# Patient Record
Sex: Female | Born: 1990
Health system: Southern US, Community
[De-identification: ages and names within clinical notes are randomized; demographics above are authoritative.]

## PROBLEM LIST (undated history)

## (undated) DIAGNOSIS — R000 Tachycardia, unspecified: Secondary | ICD-10-CM

## (undated) HISTORY — PX: TONSILLECTOMY: SUR1361

## (undated) HISTORY — DX: Tachycardia, unspecified: R00.0

---

## 2017-01-29 DIAGNOSIS — Z6841 Body Mass Index (BMI) 40.0 and over, adult: Secondary | ICD-10-CM | POA: Diagnosis not present

## 2017-01-29 DIAGNOSIS — Z23 Encounter for immunization: Secondary | ICD-10-CM | POA: Diagnosis not present

## 2017-01-29 DIAGNOSIS — Z0000 Encounter for general adult medical examination without abnormal findings: Secondary | ICD-10-CM | POA: Diagnosis not present

## 2017-01-29 DIAGNOSIS — Z1389 Encounter for screening for other disorder: Secondary | ICD-10-CM | POA: Diagnosis not present

## 2017-06-12 DIAGNOSIS — B351 Tinea unguium: Secondary | ICD-10-CM | POA: Diagnosis not present

## 2017-06-19 ENCOUNTER — Encounter: Payer: Self-pay | Admitting: Podiatry

## 2017-07-13 ENCOUNTER — Encounter: Payer: Self-pay | Admitting: Sports Medicine

## 2017-07-13 ENCOUNTER — Ambulatory Visit (INDEPENDENT_AMBULATORY_CARE_PROVIDER_SITE_OTHER): Payer: BLUE CROSS/BLUE SHIELD | Admitting: Sports Medicine

## 2017-07-13 VITALS — BP 123/74 | HR 93 | Ht 67.0 in | Wt 297.0 lb

## 2017-07-13 DIAGNOSIS — B351 Tinea unguium: Secondary | ICD-10-CM | POA: Diagnosis not present

## 2017-07-13 DIAGNOSIS — M79674 Pain in right toe(s): Secondary | ICD-10-CM | POA: Diagnosis not present

## 2017-07-13 DIAGNOSIS — L602 Onychogryphosis: Secondary | ICD-10-CM

## 2017-07-13 NOTE — Patient Instructions (Signed)

## 2017-07-13 NOTE — Progress Notes (Signed)
Subjective: Heidi Perkins is a 27 y.o. female patient seen today in office with complaint of severely painful thickened and discolored nail right great toenail.  Patient is desiring treatment for nail changes; has tried OTC topicals/Medication in the past and toenail procedures with no improvement. Reports that right great toenail is significantly painful and is becoming difficult to manage: Had the nail removed before but keeps coming back deformed. Patient has no other pedal complaints at this time.   Review of Systems  Skin:       Nail changes     There are no active problems to display for this patient.   No current outpatient medications on file prior to visit.   No current facility-administered medications on file prior to visit.     Not on File  Objective: Physical Exam  General: Well developed, nourished, no acute distress, awake, alert and oriented x 3  Vascular: Dorsalis pedis artery 2/4 bilateral, Posterior tibial artery 2/4 bilateral, skin temperature warm to warm proximal to distal bilateral lower extremities, no varicosities, pedal hair present bilateral.  Neurological: Gross sensation present via light touch bilateral.   Dermatological: Skin is warm, dry, and supple bilateral, right hallux toenail is severely thickened and a ram's horn appearance with significant amount of dried blood and debris underneath the right great toenail with mild swelling around nail fold no erythema no active drainage mild tenderness to palpation.  All other nails are within normal limits without any significant deformity or significant fungal changes, no webspace macerations present bilateral, no open lesions present bilateral, no callus/corns/hyperkeratotic tissue present bilateral. No signs of infection bilateral.  Musculoskeletal: Tenderness palpation to right great toenail.  No symptomatic boney deformities noted bilateral. Muscular strength within normal limits without painon range of  motion. No pain with calf compression bilateral.  Assessment and Plan:  Problem List Items Addressed This Visit    None    Visit Diagnoses    Dermatophytosis of nail    -  Primary   Onychogryphosis       Toe pain, right          -Examined patient -Discussed treatment options for painful dystrophic right great toenail that is severely deformed consistent with onychogryphosis and dermatophytosis Discussed treatment alternatives and plan of care; Explained permanent/temporary nail avulsion and post procedure course to patient.  Patient opts for complete removal of right first toenail and a permanent approach using phenol. - After a verbal consent, injected 3 ml of a 50:50 mixture of 2% plain  lidocaine and 0.5% plain marcaine in a normal hallux block fashion. Next, a  betadine prep was performed. Anesthesia was tested and found to be appropriate.  The offending right great toenail was then incised from the hyponychium to the epinychium. The offending right great toenail was removed in total and cleared from the field. The area was curretted for any remaining nail or spicules. Phenol application performed and the area was then flushed with alcohol and dressed with antibiotic cream and a dry sterile dressing. -Patient was instructed to leave the dressing intact for today and begin soaking  in a weak solution of betadine and water tomorrow. Patient was instructed to  soak for 15 minutes each day and apply neosporin and a gauze or bandaid dressing each day. -Patient was instructed to monitor the toe for signs of infection and return to office if toe becomes red, hot or swollen. -Patient to return to office in 2 weeks for follow-up nail bed check or sooner  if problems or issues arise.  Asencion Islamitorya Debrina Kizer, DPM

## 2017-07-26 ENCOUNTER — Ambulatory Visit (INDEPENDENT_AMBULATORY_CARE_PROVIDER_SITE_OTHER): Payer: BLUE CROSS/BLUE SHIELD | Admitting: Sports Medicine

## 2017-07-26 ENCOUNTER — Encounter: Payer: Self-pay | Admitting: Sports Medicine

## 2017-07-26 DIAGNOSIS — B351 Tinea unguium: Secondary | ICD-10-CM

## 2017-07-26 DIAGNOSIS — M79674 Pain in right toe(s): Secondary | ICD-10-CM

## 2017-07-26 DIAGNOSIS — L602 Onychogryphosis: Secondary | ICD-10-CM

## 2017-07-26 NOTE — Patient Instructions (Signed)

## 2017-07-26 NOTE — Progress Notes (Signed)
Subjective: Heidi Perkins is a 27 y.o. female patient returns to office today for follow up evaluation after having Right hallux total permanent nail avulsion performed on 07-13-17. Patient has been soaking using epsom salt and applying topical antibiotic covered with bandaid daily.Admits soreness. Patient deniesfever/chills/nausea/vomitting/any other related constitutional symptoms at this time.  There are no active problems to display for this patient.   Current Outpatient Medications on File Prior to Visit  Medication Sig Dispense Refill  . NORTREL 1/35, 28, tablet TK 1 T PO D UTD  3   No current facility-administered medications on file prior to visit.     No Known Allergies  Objective:  General: Well developed, nourished, in no acute distress, alert and oriented x3   Dermatology: Skin is warm, dry and supple bilateral. Right hallux nail bed appears to be clean, with mild fibrogranular tissue and surrounding maceration. (-) Erythema. (-) Edema. (+) minimal clear drainage present. The remaining nails appear unremarkable at this time. There are no other lesions or other signs of infection  present.  Neurovascular status: Intact. No lower extremity swelling; No pain with calf compression bilateral.  Musculoskeletal: Mild tenderness to palpation of the Right hallux. Muscular strength within normal limits bilateral.   Assesement and Plan: Problem List Items Addressed This Visit    None    Visit Diagnoses    Dermatophytosis of nail    -  Primary   Onychogryphosis       Toe pain, right         -Examined patient  -Cleansed right and gently scrubbed with moist guaze at site and applied antibiotic cream covered with bandaid.  -Discussed plan of care with patient. -Patient to continue soaking in a weak solution of Epsom salt and warm water. Patient was instructed to soak for 15-20 minutes each day until the toe appears normal and there is no drainage, redness, tenderness, or swelling  at the procedure site, and apply neosporin and a gauze or bandaid dressing each day as needed. May leave open to air at night to help area heal and to resolve the maceration. -Educated patient on long term care after nail surgery. -Patient was instructed to monitor the toe for reoccurrence and signs of infection; Patient advised to return to office or go to ER if toe becomes red, hot or swollen. -Patient is to return as needed or sooner if problems arise.  Asencion Islamitorya Birdella Sippel, DPM

## 2017-08-10 ENCOUNTER — Encounter: Payer: Self-pay | Admitting: Sports Medicine

## 2017-08-10 ENCOUNTER — Ambulatory Visit: Payer: BLUE CROSS/BLUE SHIELD | Admitting: Sports Medicine

## 2017-08-10 DIAGNOSIS — M79674 Pain in right toe(s): Secondary | ICD-10-CM

## 2017-08-10 DIAGNOSIS — Z9889 Other specified postprocedural states: Secondary | ICD-10-CM

## 2017-08-10 MED ORDER — NEOMYCIN-POLYMYXIN-HC 3.5-10000-1 OT SOLN: OTIC | 0 refills | Status: DC

## 2017-08-10 NOTE — Progress Notes (Signed)
Subjective: Heidi Perkins is a 27 y.o. female patient returns to office today for follow up evaluation after having Right hallux total permanent nail avulsion performed on 07-13-17. Patient states that she wanted to have her toe checked because she has noticed some yellow drainage and some bleeding in the base of the nail bed is still sore.  Patient reports that pain is 5 out of 10 sometimes throbbing in nature.  Patient has been soaking using epsom salt and applying topical antibiotic covered with bandaid daily. Patient deniesfever/chills/nausea/vomitting/any other related constitutional symptoms at this time.  There are no active problems to display for this patient.   Current Outpatient Medications on File Prior to Visit  Medication Sig Dispense Refill  . NORTREL 1/35, 28, tablet TK 1 T PO D UTD  3   No current facility-administered medications on file prior to visit.     No Known Allergies  Objective:  General: Well developed, nourished, in no acute distress, alert and oriented x3   Dermatology: Skin is warm, dry and supple bilateral. Right hallux nail bed appears to be clean, with mild fibrogranular tissue and surrounding maceration. (+) Focal faint erythema at proximal nail fold. (-) Edema. (-) Active drainage present. The remaining nails appear unremarkable at this time. There are no other lesions or other signs of infection  present.  Neurovascular status: Intact. No lower extremity swelling; No pain with calf compression bilateral.  Musculoskeletal: Mild tenderness to palpation of the Right hallux. Muscular strength within normal limits bilateral.   Assesement and Plan: Problem List Items Addressed This Visit    None    Visit Diagnoses    S/P nail surgery    -  Primary   Toe pain, right         -Examined patient  -Cleansed right and gently scrubbed with moist guaze at site and applied antibiotic cream covered with bandaid.  -Discussed plan of care with patient. -Patient  to continue soaking in a weak solution of Epsom salt and warm water. Patient was instructed to soak for 15-20 minutes each day until the toe appears normal and there is no drainage, redness, tenderness, or swelling at the procedure site, and apply Corticosporin solution as prescribed and a gauze or bandaid dressing each day as needed. May leave open to air at night to help area heal and to resolve the maceration. -Educated patient on long term care after nail surgery. -Patient was instructed to monitor the toe for reoccurrence and signs of infection; Patient advised to return to office or go to ER if toe becomes red, hot or swollen. -Patient is to return as needed or sooner if problems arise.  Asencion Islamitorya Amorina Doerr, DPM

## 2017-08-10 NOTE — Patient Instructions (Signed)

## 2017-11-28 DIAGNOSIS — N925 Other specified irregular menstruation: Secondary | ICD-10-CM | POA: Diagnosis not present

## 2017-11-28 DIAGNOSIS — N938 Other specified abnormal uterine and vaginal bleeding: Secondary | ICD-10-CM | POA: Diagnosis not present

## 2017-12-31 DIAGNOSIS — N925 Other specified irregular menstruation: Secondary | ICD-10-CM | POA: Diagnosis not present

## 2018-04-17 DIAGNOSIS — F17210 Nicotine dependence, cigarettes, uncomplicated: Secondary | ICD-10-CM | POA: Diagnosis not present

## 2018-04-17 DIAGNOSIS — R42 Dizziness and giddiness: Secondary | ICD-10-CM | POA: Diagnosis not present

## 2018-04-17 DIAGNOSIS — N390 Urinary tract infection, site not specified: Secondary | ICD-10-CM | POA: Diagnosis not present

## 2018-04-17 DIAGNOSIS — R0602 Shortness of breath: Secondary | ICD-10-CM | POA: Diagnosis not present

## 2018-04-17 DIAGNOSIS — B9689 Other specified bacterial agents as the cause of diseases classified elsewhere: Secondary | ICD-10-CM | POA: Diagnosis not present

## 2018-04-17 DIAGNOSIS — R51 Headache: Secondary | ICD-10-CM | POA: Diagnosis not present

## 2018-04-23 DIAGNOSIS — R1031 Right lower quadrant pain: Secondary | ICD-10-CM | POA: Diagnosis not present

## 2018-04-23 DIAGNOSIS — R109 Unspecified abdominal pain: Secondary | ICD-10-CM | POA: Diagnosis not present

## 2018-04-23 DIAGNOSIS — R1032 Left lower quadrant pain: Secondary | ICD-10-CM | POA: Diagnosis not present

## 2018-04-24 DIAGNOSIS — R109 Unspecified abdominal pain: Secondary | ICD-10-CM | POA: Diagnosis not present

## 2018-04-29 DIAGNOSIS — Z6841 Body Mass Index (BMI) 40.0 and over, adult: Secondary | ICD-10-CM | POA: Diagnosis not present

## 2018-04-29 DIAGNOSIS — R1031 Right lower quadrant pain: Secondary | ICD-10-CM | POA: Diagnosis not present

## 2018-04-29 DIAGNOSIS — N925 Other specified irregular menstruation: Secondary | ICD-10-CM | POA: Diagnosis not present

## 2018-04-30 DIAGNOSIS — R1031 Right lower quadrant pain: Secondary | ICD-10-CM | POA: Diagnosis not present

## 2018-04-30 DIAGNOSIS — R1032 Left lower quadrant pain: Secondary | ICD-10-CM | POA: Diagnosis not present

## 2018-04-30 DIAGNOSIS — K5730 Diverticulosis of large intestine without perforation or abscess without bleeding: Secondary | ICD-10-CM | POA: Diagnosis not present

## 2018-05-14 DIAGNOSIS — R1031 Right lower quadrant pain: Secondary | ICD-10-CM | POA: Diagnosis not present

## 2018-05-14 DIAGNOSIS — R1032 Left lower quadrant pain: Secondary | ICD-10-CM | POA: Diagnosis not present

## 2018-05-14 DIAGNOSIS — R1084 Generalized abdominal pain: Secondary | ICD-10-CM | POA: Diagnosis not present

## 2018-05-15 ENCOUNTER — Other Ambulatory Visit: Payer: Self-pay

## 2018-05-15 ENCOUNTER — Encounter (HOSPITAL_COMMUNITY): Payer: Self-pay | Admitting: Emergency Medicine

## 2018-05-15 ENCOUNTER — Emergency Department (HOSPITAL_COMMUNITY)
Admission: EM | Admit: 2018-05-15 | Discharge: 2018-05-15 | Disposition: A | Discharge status: Home / Self Care | Payer: BLUE CROSS/BLUE SHIELD | Attending: Emergency Medicine | Admitting: Emergency Medicine

## 2018-05-15 ENCOUNTER — Emergency Department (HOSPITAL_COMMUNITY): Payer: BLUE CROSS/BLUE SHIELD

## 2018-05-15 DIAGNOSIS — R102 Pelvic and perineal pain: Secondary | ICD-10-CM | POA: Diagnosis not present

## 2018-05-15 DIAGNOSIS — R112 Nausea with vomiting, unspecified: Secondary | ICD-10-CM

## 2018-05-15 DIAGNOSIS — N926 Irregular menstruation, unspecified: Secondary | ICD-10-CM | POA: Diagnosis not present

## 2018-05-15 LAB — LIPASE, BLOOD: Lipase: 26 U/L (ref 11–51)

## 2018-05-15 LAB — COMPREHENSIVE METABOLIC PANEL
ALBUMIN: 3.7 g/dL (ref 3.5–5.0)
ALT: 19 U/L (ref 0–44)
AST: 18 U/L (ref 15–41)
Alkaline Phosphatase: 79 U/L (ref 38–126)
Anion gap: 9 (ref 5–15)
BUN: 19 mg/dL (ref 6–20)
CHLORIDE: 107 mmol/L (ref 98–111)
CO2: 22 mmol/L (ref 22–32)
Calcium: 9 mg/dL (ref 8.9–10.3)
Creatinine, Ser: 0.61 mg/dL (ref 0.44–1.00)
GFR calc Af Amer: >60 mL/min (ref >60)
GFR calc non Af Amer: >60 mL/min (ref >60)
GLUCOSE: 92 mg/dL (ref 70–99)
Potassium: 3.9 mmol/L (ref 3.5–5.1)
SODIUM: 138 mmol/L (ref 135–145)
Total Bilirubin: 0.4 mg/dL (ref 0.3–1.2)
Total Protein: 8.1 g/dL (ref 6.5–8.1)

## 2018-05-15 LAB — CBC WITH DIFFERENTIAL/PLATELET
Abs Immature Granulocytes: 0.08 10*3/uL — ABNORMAL HIGH (ref 0.00–0.07)
Basophils Absolute: 0.1 10*3/uL (ref 0.0–0.1)
Basophils Relative: 0 %
Eosinophils Absolute: 0.1 10*3/uL (ref 0.0–0.5)
Eosinophils Relative: 1 %
HCT: 45.4 % (ref 36.0–46.0)
Hemoglobin: 13.9 g/dL (ref 12.0–15.0)
IMMATURE GRANULOCYTES: 1 %
LYMPHS PCT: 17 %
Lymphs Abs: 2.3 10*3/uL (ref 0.7–4.0)
MCH: 26.4 pg (ref 26.0–34.0)
MCHC: 30.6 g/dL (ref 30.0–36.0)
MCV: 86.3 fL (ref 80.0–100.0)
Monocytes Absolute: 0.6 10*3/uL (ref 0.1–1.0)
Monocytes Relative: 5 %
NEUTROS ABS: 10.3 10*3/uL — AB (ref 1.7–7.7)
NEUTROS PCT: 76 %
PLATELETS: 310 10*3/uL (ref 150–400)
RBC: 5.26 MIL/uL — AB (ref 3.87–5.11)
RDW: 14.2 % (ref 11.5–15.5)
WBC: 13.4 10*3/uL — AB (ref 4.0–10.5)
nRBC: 0 % (ref 0.0–0.2)

## 2018-05-15 LAB — URINALYSIS, ROUTINE W REFLEX MICROSCOPIC
Bilirubin Urine: NEGATIVE
Glucose, UA: NEGATIVE mg/dL
HGB URINE DIPSTICK: NEGATIVE
Ketones, ur: NEGATIVE mg/dL
Leukocytes, UA: NEGATIVE
Nitrite: NEGATIVE
Protein, ur: NEGATIVE mg/dL
Specific Gravity, Urine: 1.032 — ABNORMAL HIGH (ref 1.005–1.030)
pH: 5 (ref 5.0–8.0)

## 2018-05-15 LAB — HCG, QUANTITATIVE, PREGNANCY: hCG, Beta Chain, Quant, S: <1 m[IU]/mL (ref <5)

## 2018-05-15 LAB — TSH: TSH: 1.298 u[IU]/mL (ref 0.350–4.500)

## 2018-05-15 MED ORDER — DICYCLOMINE HCL 10 MG PO CAPS
10.0000 mg | ORAL_CAPSULE | Freq: Once | ORAL | Status: AC
  Administered 2018-05-15: 10 mg via ORAL
  Filled 2018-05-15: qty 1

## 2018-05-15 MED ORDER — METOCLOPRAMIDE HCL 10 MG PO TABS: 10.0000 mg | ORAL_TABLET | Freq: Four times a day (QID) | ORAL | 0 refills | Status: AC | PRN

## 2018-05-15 MED ORDER — ONDANSETRON HCL 4 MG/2ML IJ SOLN
4.0000 mg | Freq: Once | INTRAMUSCULAR | Status: AC
  Administered 2018-05-15: 4 mg via INTRAVENOUS
  Filled 2018-05-15: qty 2

## 2018-05-15 MED ORDER — DICYCLOMINE HCL 20 MG PO TABS: 20.0000 mg | ORAL_TABLET | Freq: Two times a day (BID) | ORAL | 0 refills | Status: DC | PRN

## 2018-05-15 MED ORDER — LACTATED RINGERS IV BOLUS
1000.0000 mL | Freq: Once | INTRAVENOUS | Status: AC
  Administered 2018-05-15: 1000 mL via INTRAVENOUS

## 2018-05-15 NOTE — ED Notes (Signed)
Patient verbalizes understanding of discharge instructions. Opportunity for questioning and answers were provided. Armband removed by staff, pt discharged from ED.  

## 2018-05-15 NOTE — ED Notes (Signed)
Patient transported to Ultrasound 

## 2018-05-15 NOTE — ED Provider Notes (Signed)
Emergency Department Provider Note   I have reviewed the triage vital signs and the nursing notes.   HISTORY  Chief Complaint No chief complaint on file.   HPI Heidi Perkins is a 28 y.o. female without significant past medical history the presents to the emergency department today secondary to abdominal pain.  Patient states that she has had this pain intermittently for the last month.  It is there more often than not but sometimes she does have times where it goes away.  The sharp pain that radiates from her lower abdomen up to her chest.  Oftentimes associated nausea and vomiting. No fevers. Seen by PCP and [facility] multiple times in last month for same. Has had 3 ct scans of her abdomen for the same.   No other associated or modifying symptoms.    History reviewed. No pertinent past medical history.  There are no active problems to display for this patient.   History reviewed. No pertinent surgical history.  Current Outpatient Rx  . Order #: [id number] Class: Historical Med  . Order #: [id number] Class: Historical Med  . Order #: [id number] Class: Historical Med  . Order #: [id number] Class: Print  . Order #: [id number] Class: Print    Allergies Patient has no known allergies.  No family history on file.  Social History Social History   Tobacco Use  . Smoking status: Never Smoker  . Smokeless tobacco: Never Used  Substance Use Topics  . Alcohol use: Not on file  . Drug use: Not on file    Review of Systems  All other systems negative except as documented in the HPI. All pertinent positives and negatives as reviewed in the HPI. ____________________________________________   PHYSICAL EXAM:  VITAL SIGNS: ED Triage Vitals  Enc Vitals Group     BP 05/15/18 0758 (!) 149/90     Pulse Rate 05/15/18 0758 88     Resp 05/15/18 0758 20     Temp 05/15/18 0758 98.2 F (36.8 C)     Temp Source 05/15/18 0758 Oral     SpO2 05/15/18 0758 96 %     Weight --    Height --      Head Circumference --      Peak Flow --      Pain Score 05/15/18 0855 10     Pain Loc --      Pain Edu? --      Excl. in GC? --     Constitutional: Alert and oriented. Well appearing and in no acute distress. Eyes: Conjunctivae are normal. PERRL. EOMI. Head: Atraumatic. Nose: No congestion/rhinnorhea. Mouth/Throat: Mucous membranes are moist.  Oropharynx non-erythematous. Neck: No stridor.  No meningeal signs.   Cardiovascular: Normal rate, regular rhythm. Good peripheral circulation. Grossly normal heart sounds.   Respiratory: Normal respiratory effort.  No retractions. Lungs CTAB. Gastrointestinal: Soft and nontender. No distention.  Musculoskeletal: No lower extremity tenderness nor edema. No gross deformities of extremities. Neurologic:  Normal speech and language. No gross focal neurologic deficits are appreciated.  Skin:  Skin is warm, dry and intact. No rash noted.   ____________________________________________   LABS (all labs ordered are listed, but only abnormal results are displayed)  Labs Reviewed  CBC WITH DIFFERENTIAL/PLATELET - Abnormal; Notable for the following components:      Result Value   WBC 13.4 (*)    RBC 5.26 (*)    Neutro Abs 10.3 (*)    Abs Immature Granulocytes 0.08 (*)    All other components within  normal limits  URINALYSIS, ROUTINE W REFLEX MICROSCOPIC - Abnormal; Notable for the following components:   Specific Gravity, Urine 1.032 (*)    All other components within normal limits  COMPREHENSIVE METABOLIC PANEL  TSH  LIPASE, BLOOD  HCG, QUANTITATIVE, PREGNANCY   ____________________________________________  ADIOLOGY  No results found.  ____________________________________________    INITIAL IMPRESSION / ASSESSMENT AND PLAN / ED COURSE  Work-up here unremarkable.  Did not feel the need to CT scan her as she has had 3 already with exacting symptoms and they have all been normal.  Her labs are not suggestive of a acute  intra-abdominal process.  Will try Bentyl at home.  We will follow-up with GI and her primary doctors.  Pertinent labs & imaging results that were available during my care of the patient were reviewed by me and considered in my medical decision making (see chart for details).  ____________________________________________  FINAL CLINICAL IMPRESSION(S) / ED DIAGNOSES  Final diagnoses:  Pelvic pain  Nausea and vomiting, intractability of vomiting not specified, unspecified vomiting type     MEDICATIONS GIVEN DURING THIS VISIT:  Medications  ondansetron (ZOFRAN) injection 4 mg (4 mg Intravenous Given 05/15/18 0952)  lactated ringers bolus 1,000 mL (0 mLs Intravenous Stopped 05/15/18 1215)  dicyclomine (BENTYL) capsule 10 mg (10 mg Oral Given 05/15/18 0952)     NEW OUTPATIENT MEDICATIONS STARTED DURING THIS VISIT:  Discharge Medication List as of 05/15/2018  1:44 PM    START taking these medications   Details  dicyclomine (BENTYL) 20 MG tablet Take 1 tablet (20 mg total) by mouth 2 (two) times daily as needed for spasms (abdominal cramping)., Starting Wed 05/15/2018, Print    metoCLOPramide (REGLAN) 10 MG tablet Take 1 tablet (10 mg total) by mouth every 6 (six) hours as needed for nausea (nausea/headache)., Starting Wed 05/15/2018, Print        Note:  This note was prepared with assistance of Dragon voice recognition software. Occasional wrong-word or sound-a-like substitutions may have occurred due to the inherent limitations of voice recognition software.   Marily MemosMesner, Amarrah Meinhart, MD 05/16/18 2055

## 2018-05-15 NOTE — ED Triage Notes (Signed)
Pt here with c/o abd pain times 1 month , pt has been seen for the same at [facility] twice , c/o n/v also

## 2018-05-17 DIAGNOSIS — R1030 Lower abdominal pain, unspecified: Secondary | ICD-10-CM | POA: Diagnosis not present

## 2018-05-17 DIAGNOSIS — R102 Pelvic and perineal pain: Secondary | ICD-10-CM | POA: Diagnosis not present

## 2018-06-04 DIAGNOSIS — Z79899 Other long term (current) drug therapy: Secondary | ICD-10-CM | POA: Diagnosis not present

## 2018-06-04 DIAGNOSIS — R109 Unspecified abdominal pain: Secondary | ICD-10-CM | POA: Diagnosis not present

## 2018-06-07 DIAGNOSIS — R1084 Generalized abdominal pain: Secondary | ICD-10-CM | POA: Diagnosis not present

## 2018-06-07 DIAGNOSIS — R110 Nausea: Secondary | ICD-10-CM | POA: Diagnosis not present

## 2018-06-08 DIAGNOSIS — R110 Nausea: Secondary | ICD-10-CM | POA: Diagnosis not present

## 2018-06-08 DIAGNOSIS — R1084 Generalized abdominal pain: Secondary | ICD-10-CM | POA: Diagnosis not present

## 2018-06-24 DIAGNOSIS — R109 Unspecified abdominal pain: Secondary | ICD-10-CM | POA: Diagnosis not present

## 2018-06-26 DIAGNOSIS — R109 Unspecified abdominal pain: Secondary | ICD-10-CM | POA: Diagnosis not present

## 2018-07-01 DIAGNOSIS — Z3042 Encounter for surveillance of injectable contraceptive: Secondary | ICD-10-CM | POA: Diagnosis not present

## 2018-07-01 DIAGNOSIS — Z3009 Encounter for other general counseling and advice on contraception: Secondary | ICD-10-CM | POA: Diagnosis not present

## 2018-07-03 DIAGNOSIS — F900 Attention-deficit hyperactivity disorder, predominantly inattentive type: Secondary | ICD-10-CM | POA: Diagnosis not present

## 2018-07-08 DIAGNOSIS — Z6841 Body Mass Index (BMI) 40.0 and over, adult: Secondary | ICD-10-CM | POA: Diagnosis not present

## 2018-07-08 DIAGNOSIS — Z0289 Encounter for other administrative examinations: Secondary | ICD-10-CM | POA: Diagnosis not present

## 2018-07-08 DIAGNOSIS — R1084 Generalized abdominal pain: Secondary | ICD-10-CM | POA: Diagnosis not present

## 2018-07-15 DIAGNOSIS — Z3202 Encounter for pregnancy test, result negative: Secondary | ICD-10-CM | POA: Diagnosis not present

## 2018-07-15 DIAGNOSIS — Z3042 Encounter for surveillance of injectable contraceptive: Secondary | ICD-10-CM | POA: Diagnosis not present

## 2018-09-10 DIAGNOSIS — R109 Unspecified abdominal pain: Secondary | ICD-10-CM | POA: Diagnosis not present

## 2018-09-10 DIAGNOSIS — K589 Irritable bowel syndrome without diarrhea: Secondary | ICD-10-CM | POA: Diagnosis not present

## 2018-09-10 DIAGNOSIS — Z1331 Encounter for screening for depression: Secondary | ICD-10-CM | POA: Diagnosis not present

## 2018-09-10 DIAGNOSIS — R102 Pelvic and perineal pain: Secondary | ICD-10-CM | POA: Diagnosis not present

## 2018-09-10 DIAGNOSIS — R5382 Chronic fatigue, unspecified: Secondary | ICD-10-CM | POA: Diagnosis not present

## 2018-09-12 NOTE — Progress Notes (Signed)
TELEHEALTH VISIT  Referring Provider: Alinda DeemPenner, Pamela, MD Primary Care Physician:  Alinda DeemPenner, Pamela, MD   Tele-visit due to COVID-19 pandemic Patient requested visit virtually, consented to the virtual encounter via video enabled telemedicine application (Doximity, converted to audio encounter due to delay in transmission with Doximity): 3:05 09/13/18 Contact made at:  Patient verified by name and date of birth Location of patient: Home Location provider: [facility] Names of persons participating: Me, patient, [name] CMA Time spent on telehealth visit: 28 minutes I discussed the limitations of evaluation and management by telemedicine. The patient expressed understanding and agreed to proceed.  Reason for Consultation:  Abdominal pain   IMPRESSION:  Suprapubic abdominal pain  Atypical location for GI etiologies. Will obtain recent records to review CT, ultrasound, and stools studies.  Will arrange for colonoscopy to complete her GI evaluation. Follow-up with OB/GYN will be important given the location of her pain.   PLAN: Obtain results from the CT [facility]), ultrasound [facility]), and stool studies Trial of Bentyl 20 mg QID as needed for pain Avoid all NSAIDs Colonoscopy when Covid19 restrictions are lifted  HPI: Heidi Perkins is a 28 y.o. female [facility] for further evaluation of abdominal pain.  The history is obtained to the patient and review of her electronic health record. She has had multiple ED evaluations for abdominal pain.   She has had 5 months of severe sharp daily 10/10 suprapubic abdominal pain with associated cramping. Located below the umbilicus in the groin in a band like distribution. Initially felt like menstrual cramps, but the pain is too severe and common. Radiating to back.  Associated nausea, headache, and subjective fever.  The pain is aggravated by standing, movement,   and stress (paying bills, missing work are the examples).  The patient reports progressive fatigue.  The pain is relieved by heating pad (takes 2-3 hours for improvement) and ibuprofen. No associated constipation or diarrhea. No change in bowel habits.  No other associated symptoms. No identified exacerbating or relieving features.   Trial ibuprofen 800 mg, Naproxen, Bentyl,  Linzess has not provided any relief.   Evaluation has [facility], ultrasound with OB/GYN, and stool testing.  Referral records note that all tests were negative. She feels that her GYN has fully evaluated her complaints.   Review of EPIC shows a transvaginal ultrasound from 05/15/2018 that was limited during an evaluation in the ED for abdominal pain.  The ovaries were not visualized and not assessed.  The uterus appeared grossly unremarkable.  No known family history of colon cancer or polyps. No family history of uterine/endometrial cancer, pancreatic cancer or gastric/stomach cancer.  Past Medical History:  Diagnosis Date   Increased heart rate     Past Surgical History:  Procedure Laterality Date   TONSILLECTOMY      Current Outpatient Medications  Medication Sig Dispense Refill   dicyclomine (BENTYL) 20 MG tablet Take 1 tablet (20 mg total) by mouth 2 (two) times daily as needed for spasms (abdominal cramping). 20 tablet 0   ibuprofen (ADVIL,MOTRIN) 800 MG tablet Take 800 mg by mouth every 8 (eight) hours as needed for moderate pain.     metoCLOPramide (REGLAN) 10 MG tablet Take 1 tablet (10 mg total) by mouth every 6 (six) hours as needed for nausea (nausea/headache). 30 tablet 0   PRESCRIPTION MEDICATION Depo  Provera BC injection q 3 months  No current facility-administered medications for this visit.     Allergies as of 09/13/2018   (No Known Allergies)    Family History  Problem Relation Age of Onset   Breast cancer Mother    Diabetes Father    Heart disease  Maternal Grandmother    Colon cancer Neg Hx     Social History   Socioeconomic History   Marital status: Single    Spouse name: Not on file   Number of children: 0   Years of education: Not on file   Highest education level: Not on file  Occupational History   Not on file  Social Needs   Financial resource strain: Not on file   Food insecurity:    Worry: Not on file    Inability: Not on file   Transportation needs:    Medical: Not on file    Non-medical: Not on file  Tobacco Use   Smoking status: Never Smoker   Smokeless tobacco: Never Used  Substance and Sexual Activity   Alcohol use: Yes    Comment: occasional    Drug use: Never   Sexual activity: Not on file  Lifestyle   Physical activity:    Days per week: Not on file    Minutes per session: Not on file   Stress: Not on file  Relationships   Social connections:    Talks on phone: Not on file    Gets together: Not on file    Attends religious service: Not on file    Active member of club or organization: Not on file    Attends meetings of clubs or organizations: Not on file    Relationship status: Not on file   Intimate partner violence:    Fear of current or ex partner: Not on file    Emotionally abused: Not on file    Physically abused: Not on file    Forced sexual activity: Not on file  Other Topics Concern   Not on file  Social History Narrative   Not on file    Review of Systems: ALL ROS discussed and all others negative except listed in HPI.  Physical Exam: General: in no acute distress Neuro: Alert and appropriate Abd: localizes pain to the suprapubic region with radiation to both the right and left Psych: Normal affect and normal insight Exam otherwise limited due to telehealth encounter  Sharnelle Cappelli Perkins. Orvan Falconer, MD, MPH [facility] 09/12/2018, 3:30 PM

## 2018-09-13 ENCOUNTER — Ambulatory Visit (INDEPENDENT_AMBULATORY_CARE_PROVIDER_SITE_OTHER): Payer: BLUE CROSS/BLUE SHIELD | Admitting: Gastroenterology

## 2018-09-13 ENCOUNTER — Other Ambulatory Visit: Payer: Self-pay

## 2018-09-13 ENCOUNTER — Encounter: Payer: Self-pay | Admitting: Gastroenterology

## 2018-09-13 VITALS — Ht 67.0 in | Wt 330.0 lb

## 2018-09-13 DIAGNOSIS — R109 Unspecified abdominal pain: Secondary | ICD-10-CM

## 2018-09-13 MED ORDER — DICYCLOMINE HCL 20 MG PO TABS: 20.0000 mg | ORAL_TABLET | Freq: Four times a day (QID) | ORAL | 1 refills | Status: AC | PRN

## 2018-09-13 NOTE — Patient Instructions (Addendum)
If you are age 28 or older, your body mass index should be between 23-30. Your Body mass index is 51.69 kg/m. If this is out of the aforementioned range listed, please consider follow up with your Primary Care Provider.  If you are age 58 or younger, your body mass index should be between 19-25. Your Body mass index is 51.69 kg/m. If this is out of the aformentioned range listed, please consider follow up with your Primary Care Provider.   I will obtain the records from your recent CT scan and ultrasound.   Continue to take Bentyl 20 mg, but, increase the frequency to four times daily as needed.   We will contact you to schedule a colonoscopy as soon as the Covid19 restrictions have been released.  Thank you for your patience with me and our technology today! Please stay home, safe, and healthy. I look forward to meeting you in person in the future.   Tressia Danas, MD

## 2018-09-16 DIAGNOSIS — R1031 Right lower quadrant pain: Secondary | ICD-10-CM | POA: Diagnosis not present

## 2018-10-08 DIAGNOSIS — Z3042 Encounter for surveillance of injectable contraceptive: Secondary | ICD-10-CM | POA: Diagnosis not present

## 2018-11-26 DIAGNOSIS — S61211A Laceration without foreign body of left index finger without damage to nail, initial encounter: Secondary | ICD-10-CM | POA: Diagnosis not present

## 2018-12-18 DIAGNOSIS — Z6841 Body Mass Index (BMI) 40.0 and over, adult: Secondary | ICD-10-CM | POA: Diagnosis not present

## 2018-12-18 DIAGNOSIS — M545 Low back pain: Secondary | ICD-10-CM | POA: Diagnosis not present

## 2018-12-26 DIAGNOSIS — Z3042 Encounter for surveillance of injectable contraceptive: Secondary | ICD-10-CM | POA: Diagnosis not present

## 2019-02-03 DIAGNOSIS — Z03818 Encounter for observation for suspected exposure to other biological agents ruled out: Secondary | ICD-10-CM | POA: Diagnosis not present

## 2019-02-03 DIAGNOSIS — K589 Irritable bowel syndrome without diarrhea: Secondary | ICD-10-CM | POA: Diagnosis not present

## 2019-02-03 DIAGNOSIS — R109 Unspecified abdominal pain: Secondary | ICD-10-CM | POA: Diagnosis not present

## 2019-02-05 DIAGNOSIS — K589 Irritable bowel syndrome without diarrhea: Secondary | ICD-10-CM | POA: Diagnosis not present

## 2019-02-05 DIAGNOSIS — R109 Unspecified abdominal pain: Secondary | ICD-10-CM | POA: Diagnosis not present

## 2019-02-05 DIAGNOSIS — Z03818 Encounter for observation for suspected exposure to other biological agents ruled out: Secondary | ICD-10-CM | POA: Diagnosis not present

## 2019-03-05 DIAGNOSIS — R102 Pelvic and perineal pain: Secondary | ICD-10-CM | POA: Diagnosis not present

## 2019-03-05 DIAGNOSIS — K589 Irritable bowel syndrome without diarrhea: Secondary | ICD-10-CM | POA: Diagnosis not present

## 2019-03-05 DIAGNOSIS — R109 Unspecified abdominal pain: Secondary | ICD-10-CM | POA: Diagnosis not present

## 2019-03-17 DIAGNOSIS — Z3042 Encounter for surveillance of injectable contraceptive: Secondary | ICD-10-CM | POA: Diagnosis not present

## 2019-06-13 DIAGNOSIS — Z3042 Encounter for surveillance of injectable contraceptive: Secondary | ICD-10-CM | POA: Diagnosis not present

## 2019-07-18 DIAGNOSIS — Z01419 Encounter for gynecological examination (general) (routine) without abnormal findings: Secondary | ICD-10-CM | POA: Diagnosis not present

## 2019-08-28 DIAGNOSIS — Z3042 Encounter for surveillance of injectable contraceptive: Secondary | ICD-10-CM | POA: Diagnosis not present

## 2019-11-13 DIAGNOSIS — Z3042 Encounter for surveillance of injectable contraceptive: Secondary | ICD-10-CM | POA: Diagnosis not present

## 2020-02-02 DIAGNOSIS — Z3042 Encounter for surveillance of injectable contraceptive: Secondary | ICD-10-CM | POA: Diagnosis not present

## 2020-02-03 DIAGNOSIS — R102 Pelvic and perineal pain: Secondary | ICD-10-CM | POA: Diagnosis not present

## 2020-02-03 DIAGNOSIS — R109 Unspecified abdominal pain: Secondary | ICD-10-CM | POA: Diagnosis not present

## 2020-02-03 DIAGNOSIS — K589 Irritable bowel syndrome without diarrhea: Secondary | ICD-10-CM | POA: Diagnosis not present

## 2020-02-03 DIAGNOSIS — G8929 Other chronic pain: Secondary | ICD-10-CM | POA: Diagnosis not present

## 2020-04-20 DIAGNOSIS — Z3042 Encounter for surveillance of injectable contraceptive: Secondary | ICD-10-CM | POA: Diagnosis not present

## 2020-04-26 DIAGNOSIS — Z3042 Encounter for surveillance of injectable contraceptive: Secondary | ICD-10-CM | POA: Diagnosis not present

## 2020-07-06 DIAGNOSIS — Z3042 Encounter for surveillance of injectable contraceptive: Secondary | ICD-10-CM | POA: Diagnosis not present

## 2020-07-21 DIAGNOSIS — Z01419 Encounter for gynecological examination (general) (routine) without abnormal findings: Secondary | ICD-10-CM | POA: Diagnosis not present

## 2020-09-21 DIAGNOSIS — Z3042 Encounter for surveillance of injectable contraceptive: Secondary | ICD-10-CM | POA: Diagnosis not present

## 2020-09-21 DIAGNOSIS — Z1331 Encounter for screening for depression: Secondary | ICD-10-CM | POA: Diagnosis not present

## 2020-09-21 DIAGNOSIS — Z0000 Encounter for general adult medical examination without abnormal findings: Secondary | ICD-10-CM | POA: Diagnosis not present

## 2020-09-21 DIAGNOSIS — Z6841 Body Mass Index (BMI) 40.0 and over, adult: Secondary | ICD-10-CM | POA: Diagnosis not present

## 2020-09-29 DIAGNOSIS — G4733 Obstructive sleep apnea (adult) (pediatric): Secondary | ICD-10-CM | POA: Diagnosis not present

## 2020-09-29 DIAGNOSIS — G8929 Other chronic pain: Secondary | ICD-10-CM | POA: Diagnosis not present

## 2020-09-29 DIAGNOSIS — K589 Irritable bowel syndrome without diarrhea: Secondary | ICD-10-CM | POA: Diagnosis not present

## 2020-09-29 DIAGNOSIS — R109 Unspecified abdominal pain: Secondary | ICD-10-CM | POA: Diagnosis not present

## 2020-10-11 IMAGING — US US PELVIS COMPLETE TRANSABD/TRANSVAG W DUPLEX
1 series · 14 of 25 positions shown · non-contrast
Comparison: None.

CLINICAL DATA: Patient with pelvic pain for 1 month. Irregular
menstrual cycle

EXAM:
TRANSABDOMINAL AND TRANSVAGINAL ULTRASOUND OF PELVIS
DOPPLER ULTRASOUND OF OVARIES
TECHNIQUE: Both transabdominal and transvaginal ultrasound examinations of the
pelvis were performed. Transabdominal technique was performed for
global imaging of the pelvis including uterus, ovaries, adnexal
regions, and pelvic cul-de-sac.
It was necessary to proceed with endovaginal exam following the
transabdominal exam to visualize the endometrium. Color and duplex
Doppler ultrasound was utilized to evaluate blood flow to the
ovaries.

[Series 1: us pelvis complete transabd/transvag w duplex · 14 of 101 slices shown]
[im 1/101]
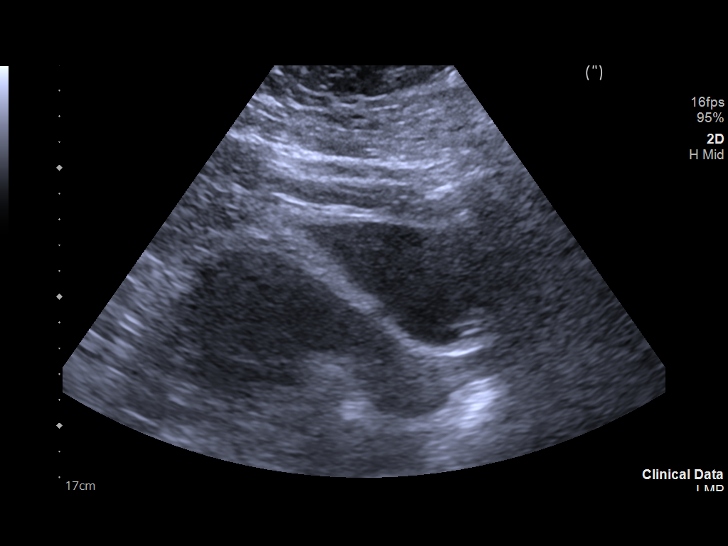
[im 9/101]
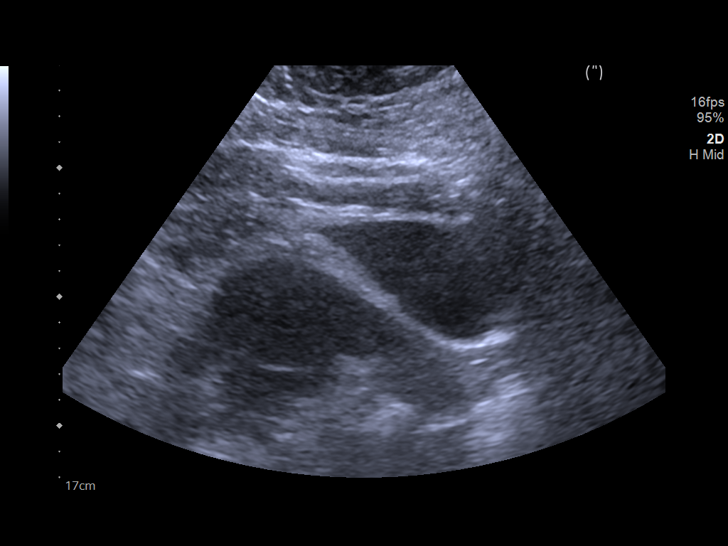
[im 17/101]
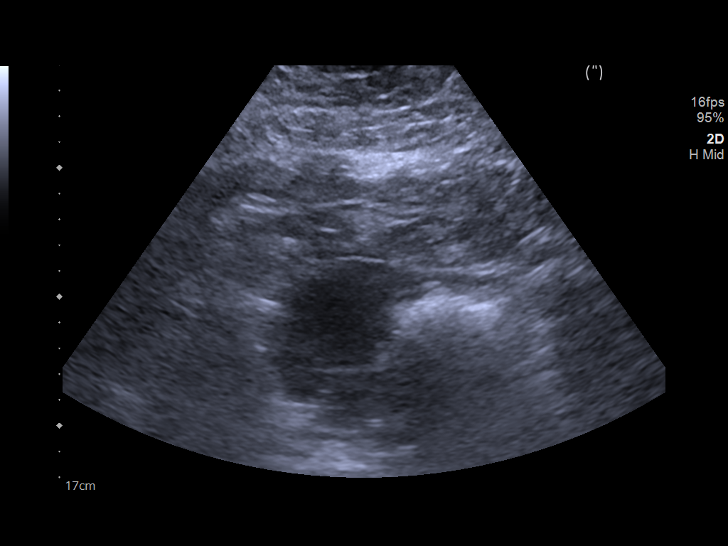
[im 26/101]
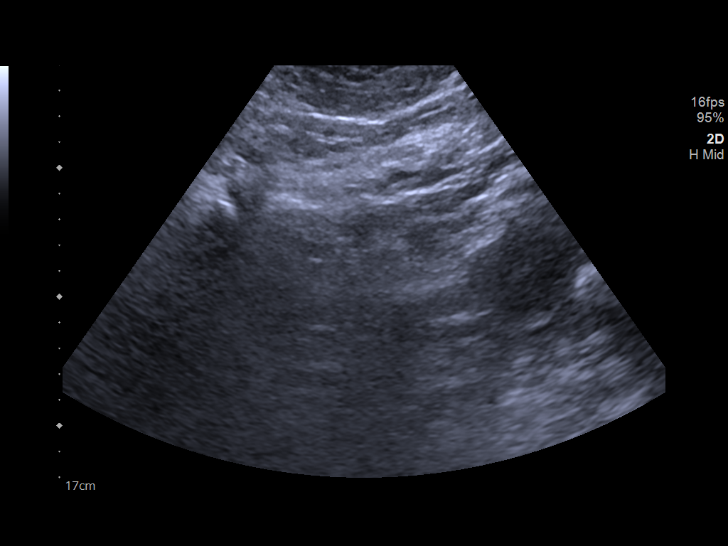
[im 34/101]
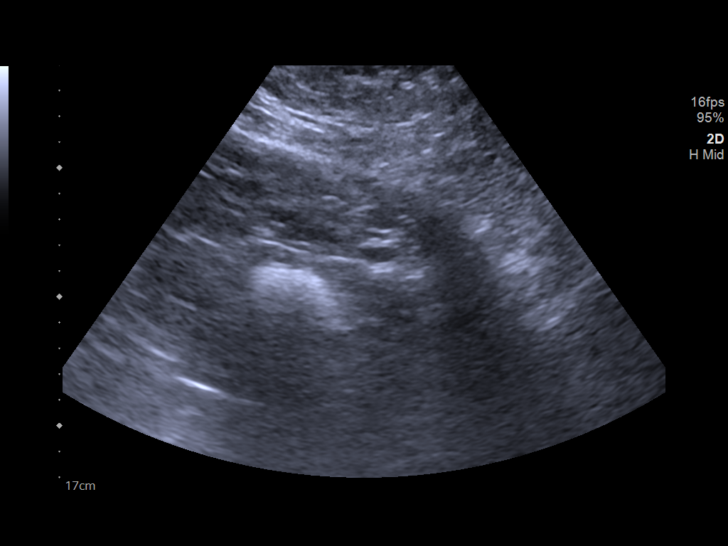
[im 38/101]
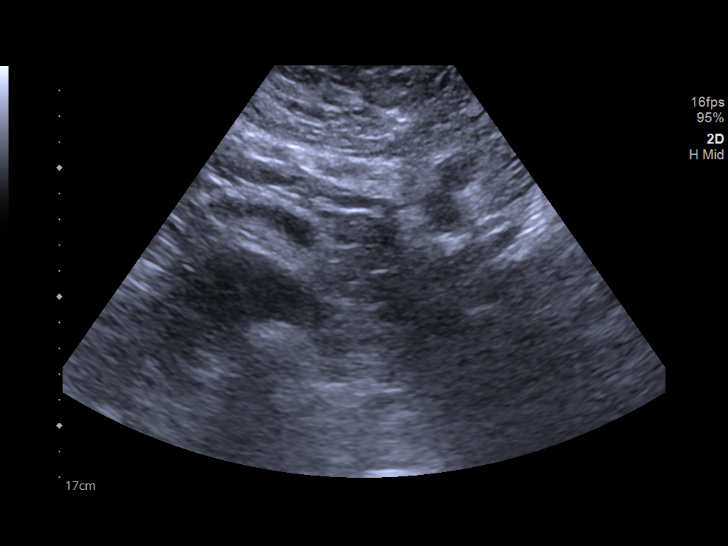
[im 46/101]
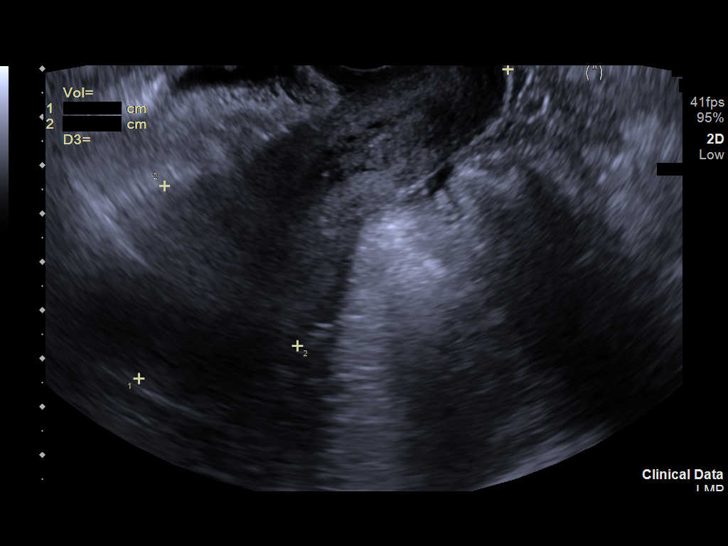
[im 55/101]
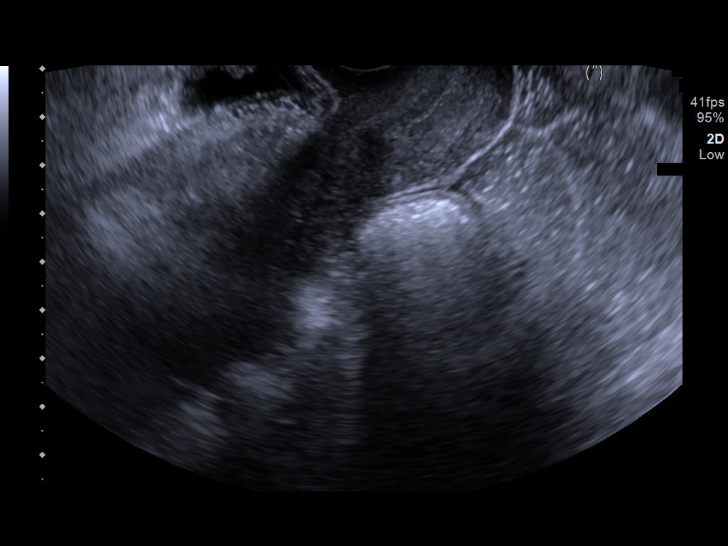
[im 63/101]
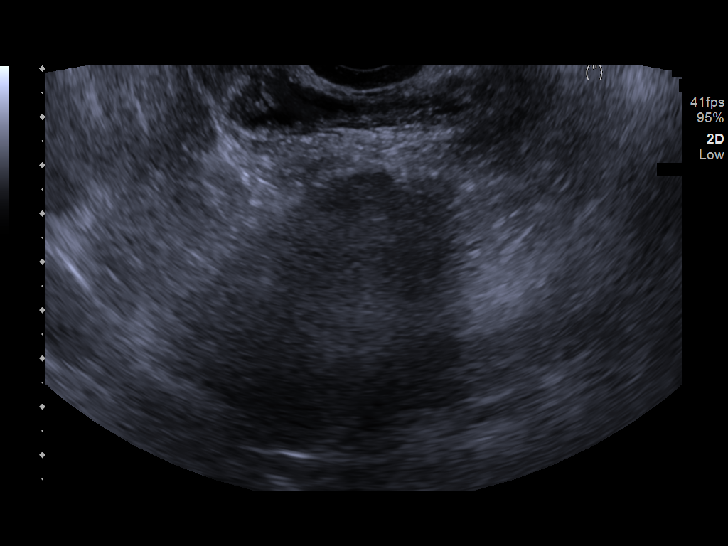
[im 67/101]
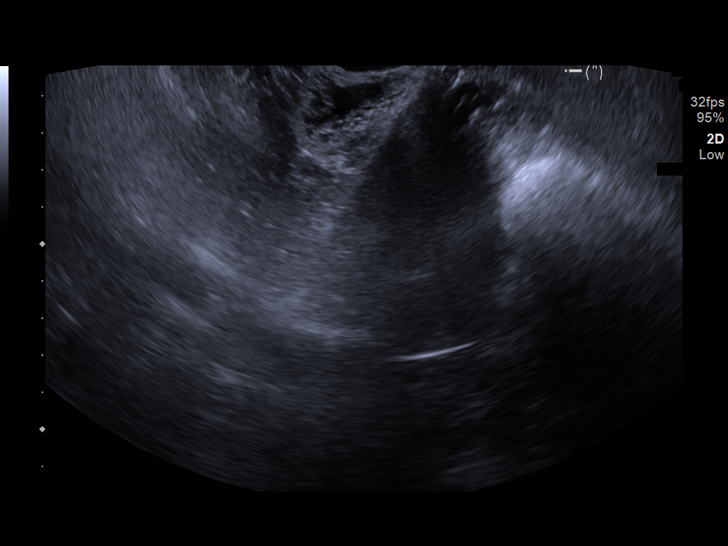
[im 76/101]
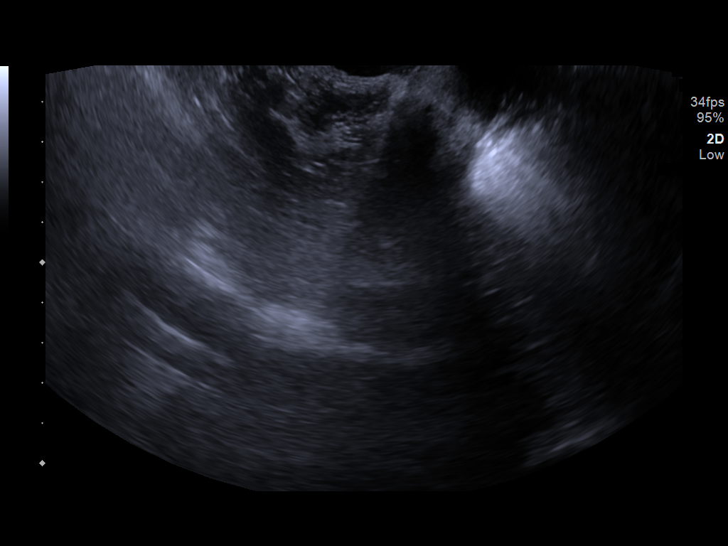
[im 84/101]
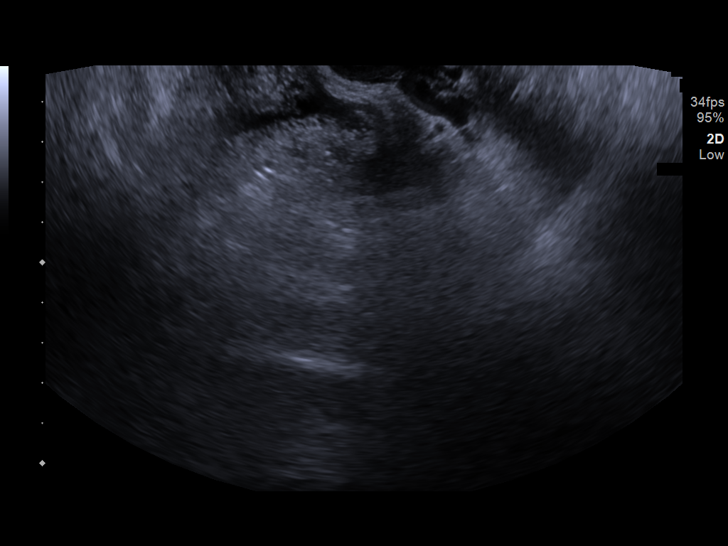
[im 92/101]
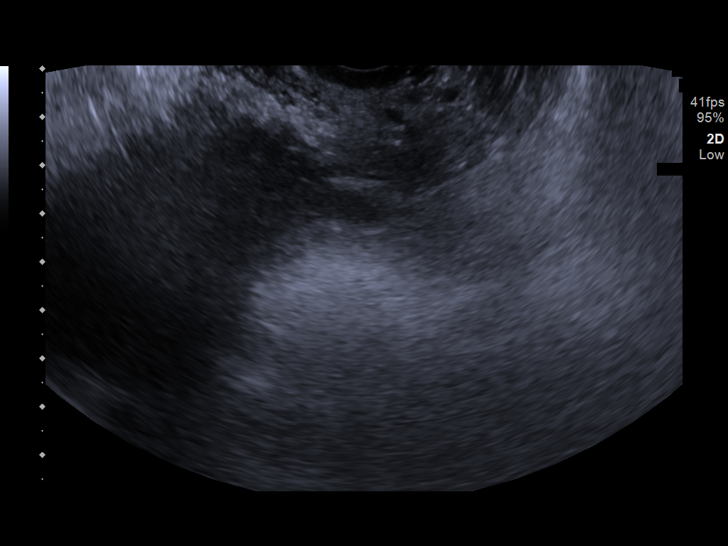
[im 101/101]
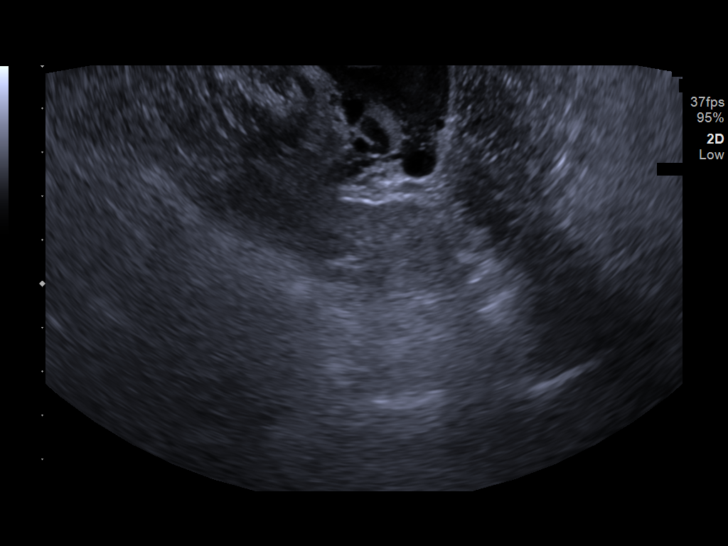

[14 of 25 positions shown; findings below may reference images not displayed]

FINDINGS: Uterus

Measurements: 9.9 x 4.3 x 4.9 cm = volume: 109.4 mL. No fibroids or
other mass visualized.

Endometrium

Poorly visualized.

Right ovary

Not visualized.

Left ovary

Not visualized.

Pulsed Doppler evaluation of both ovaries demonstrates normal
low-resistance arterial and venous waveforms.

Other findings

No abnormal free fluid.
IMPRESSION: Limited exam. The ovaries are not visualized and therefore not
assessed.

Grossly unremarkable appearance of the uterus.

## 2020-10-27 DIAGNOSIS — R109 Unspecified abdominal pain: Secondary | ICD-10-CM | POA: Diagnosis not present

## 2020-10-27 DIAGNOSIS — G8929 Other chronic pain: Secondary | ICD-10-CM | POA: Diagnosis not present

## 2020-10-27 DIAGNOSIS — K589 Irritable bowel syndrome without diarrhea: Secondary | ICD-10-CM | POA: Diagnosis not present

## 2020-10-27 DIAGNOSIS — G4733 Obstructive sleep apnea (adult) (pediatric): Secondary | ICD-10-CM | POA: Diagnosis not present

## 2020-11-10 DIAGNOSIS — G8929 Other chronic pain: Secondary | ICD-10-CM | POA: Diagnosis not present

## 2020-11-10 DIAGNOSIS — K589 Irritable bowel syndrome without diarrhea: Secondary | ICD-10-CM | POA: Diagnosis not present

## 2020-11-10 DIAGNOSIS — G4733 Obstructive sleep apnea (adult) (pediatric): Secondary | ICD-10-CM | POA: Diagnosis not present

## 2020-11-10 DIAGNOSIS — R109 Unspecified abdominal pain: Secondary | ICD-10-CM | POA: Diagnosis not present

## 2020-12-29 DIAGNOSIS — Z3202 Encounter for pregnancy test, result negative: Secondary | ICD-10-CM | POA: Diagnosis not present

## 2020-12-29 DIAGNOSIS — Z3042 Encounter for surveillance of injectable contraceptive: Secondary | ICD-10-CM | POA: Diagnosis not present

## 2021-01-21 DIAGNOSIS — G4733 Obstructive sleep apnea (adult) (pediatric): Secondary | ICD-10-CM | POA: Diagnosis not present

## 2021-02-20 DIAGNOSIS — G4733 Obstructive sleep apnea (adult) (pediatric): Secondary | ICD-10-CM | POA: Diagnosis not present

## 2021-03-16 DIAGNOSIS — Z3042 Encounter for surveillance of injectable contraceptive: Secondary | ICD-10-CM | POA: Diagnosis not present

## 2021-03-23 DIAGNOSIS — G4733 Obstructive sleep apnea (adult) (pediatric): Secondary | ICD-10-CM | POA: Diagnosis not present

## 2021-04-05 DIAGNOSIS — G4733 Obstructive sleep apnea (adult) (pediatric): Secondary | ICD-10-CM | POA: Diagnosis not present

## 2021-04-05 DIAGNOSIS — G8929 Other chronic pain: Secondary | ICD-10-CM | POA: Diagnosis not present

## 2021-04-05 DIAGNOSIS — R109 Unspecified abdominal pain: Secondary | ICD-10-CM | POA: Diagnosis not present

## 2021-04-05 DIAGNOSIS — K589 Irritable bowel syndrome without diarrhea: Secondary | ICD-10-CM | POA: Diagnosis not present

## 2021-04-22 DIAGNOSIS — G4733 Obstructive sleep apnea (adult) (pediatric): Secondary | ICD-10-CM | POA: Diagnosis not present

## 2021-05-23 DIAGNOSIS — G4733 Obstructive sleep apnea (adult) (pediatric): Secondary | ICD-10-CM | POA: Diagnosis not present

## 2021-06-03 DIAGNOSIS — Z3042 Encounter for surveillance of injectable contraceptive: Secondary | ICD-10-CM | POA: Diagnosis not present

## 2021-06-23 DIAGNOSIS — G4733 Obstructive sleep apnea (adult) (pediatric): Secondary | ICD-10-CM | POA: Diagnosis not present

## 2021-07-21 DIAGNOSIS — G4733 Obstructive sleep apnea (adult) (pediatric): Secondary | ICD-10-CM | POA: Diagnosis not present

## 2021-08-10 DIAGNOSIS — Z01419 Encounter for gynecological examination (general) (routine) without abnormal findings: Secondary | ICD-10-CM | POA: Diagnosis not present

## 2021-08-11 DIAGNOSIS — G4733 Obstructive sleep apnea (adult) (pediatric): Secondary | ICD-10-CM | POA: Diagnosis not present

## 2021-08-11 DIAGNOSIS — R109 Unspecified abdominal pain: Secondary | ICD-10-CM | POA: Diagnosis not present

## 2021-08-11 DIAGNOSIS — G8929 Other chronic pain: Secondary | ICD-10-CM | POA: Diagnosis not present

## 2021-08-11 DIAGNOSIS — K589 Irritable bowel syndrome without diarrhea: Secondary | ICD-10-CM | POA: Diagnosis not present

## 2021-08-21 DIAGNOSIS — G4733 Obstructive sleep apnea (adult) (pediatric): Secondary | ICD-10-CM | POA: Diagnosis not present

## 2021-08-22 DIAGNOSIS — Z3042 Encounter for surveillance of injectable contraceptive: Secondary | ICD-10-CM | POA: Diagnosis not present

## 2021-09-20 DIAGNOSIS — G4733 Obstructive sleep apnea (adult) (pediatric): Secondary | ICD-10-CM | POA: Diagnosis not present

## 2021-10-21 DIAGNOSIS — G4733 Obstructive sleep apnea (adult) (pediatric): Secondary | ICD-10-CM | POA: Diagnosis not present

## 2021-11-07 DIAGNOSIS — Z3042 Encounter for surveillance of injectable contraceptive: Secondary | ICD-10-CM | POA: Diagnosis not present

## 2021-11-20 DIAGNOSIS — G4733 Obstructive sleep apnea (adult) (pediatric): Secondary | ICD-10-CM | POA: Diagnosis not present

## 2021-12-26 DIAGNOSIS — G4733 Obstructive sleep apnea (adult) (pediatric): Secondary | ICD-10-CM | POA: Diagnosis not present

## 2023-08-01 DIAGNOSIS — I517 Cardiomegaly: Secondary | ICD-10-CM
